# Patient Record
Sex: Male | Born: 2005 | Race: Black or African American | Hispanic: No | Marital: Single
Health system: Southern US, Community
[De-identification: ages and names within clinical notes are randomized; demographics above are authoritative.]

---
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
## 2023-04-05 IMAGING — CT CT MAXILLOFACIAL W/O CM
3 series · 16 of 47 positions shown, 19 images · non-contrast
Comparison: None.

CLINICAL DATA: Fall, facial trauma

EXAM:
CT MAXILLOFACIAL WITHOUT CONTRAST
TECHNIQUE: Multidetector CT imaging of the maxillofacial structures was
performed. Multiplanar CT image reconstructions were also generated.

[Series 3: facialbone 2.0 st · axial · 0.37mm/px · z∈[-156,-8]mm · 10 of 86 slices shown, 13 images]
[im 6/86  brain]
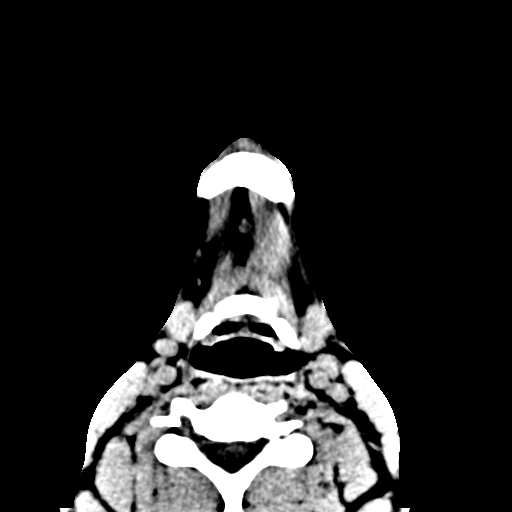
[im 6/86  bone]
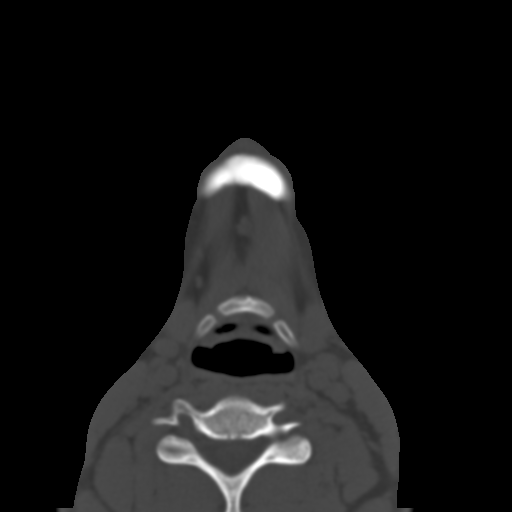
[im 15/86  bone]
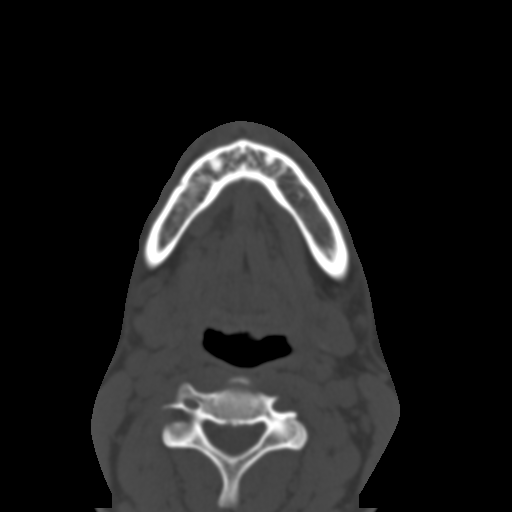
[im 24/86  bone]
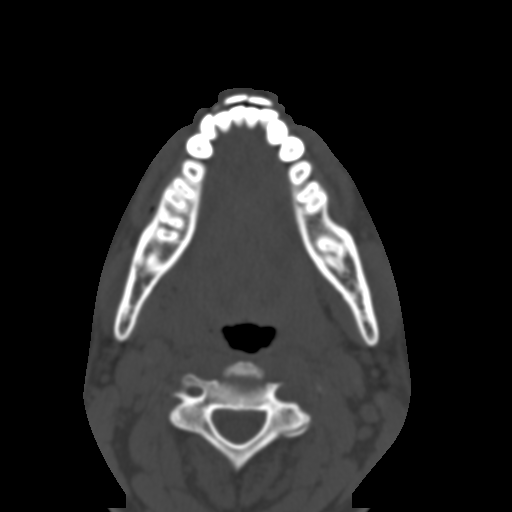
[im 30/86  bone]
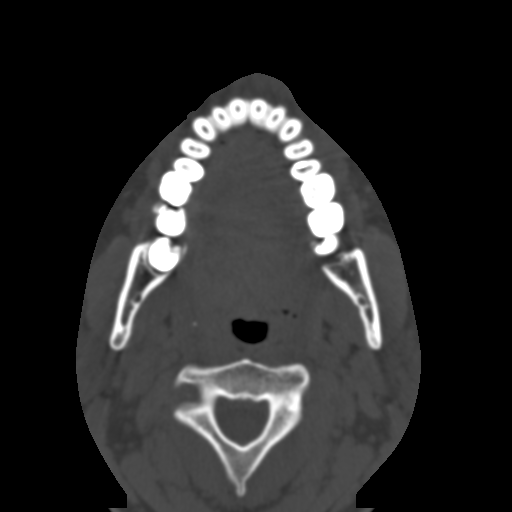
[im 39/86  brain]
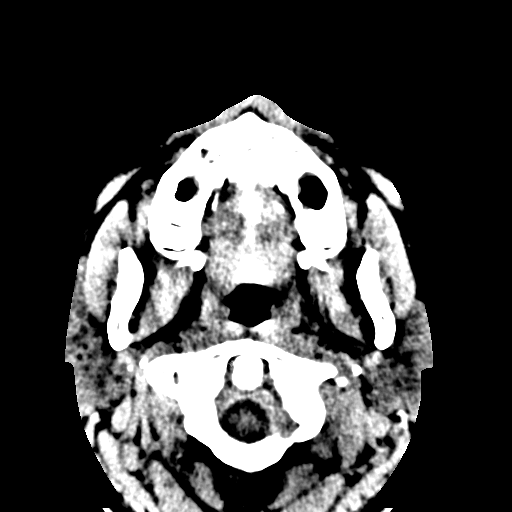
[im 39/86  bone]
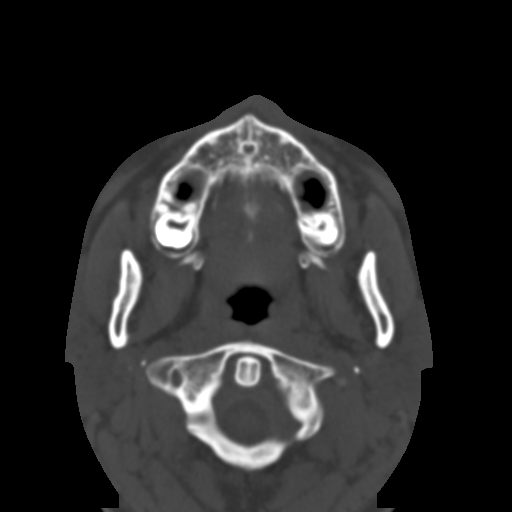
[im 47/86  bone]
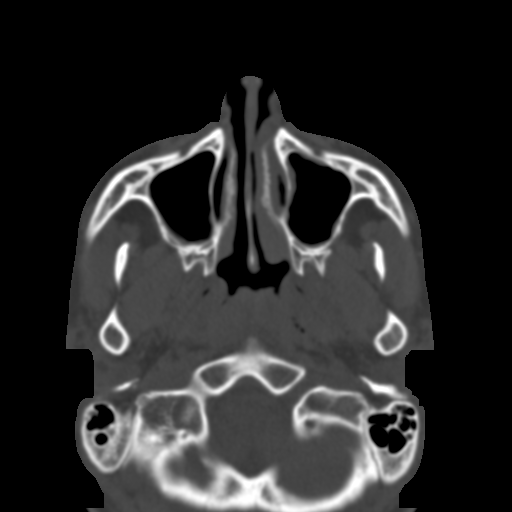
[im 56/86  bone]
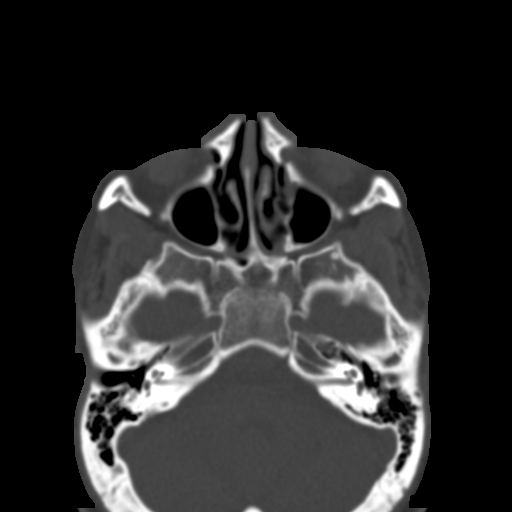
[im 65/86  bone]
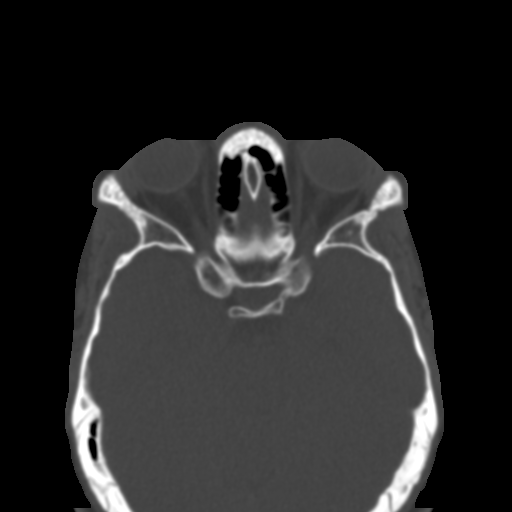
[im 71/86  brain]
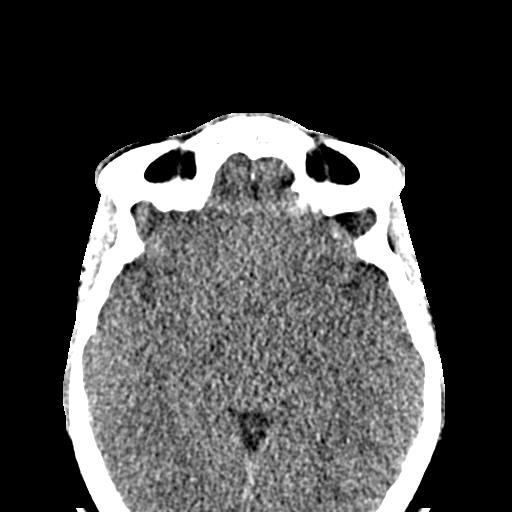
[im 71/86  bone]
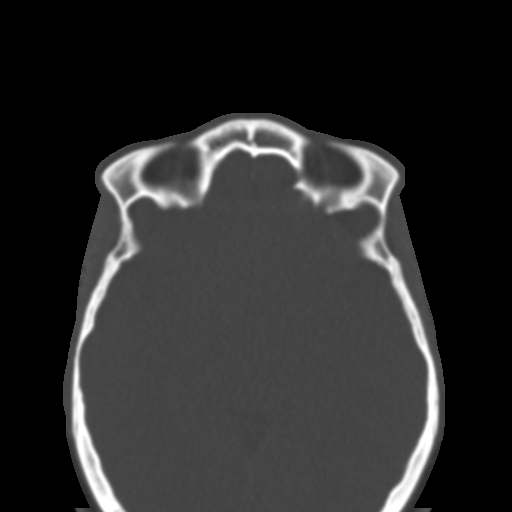
[im 80/86  bone]
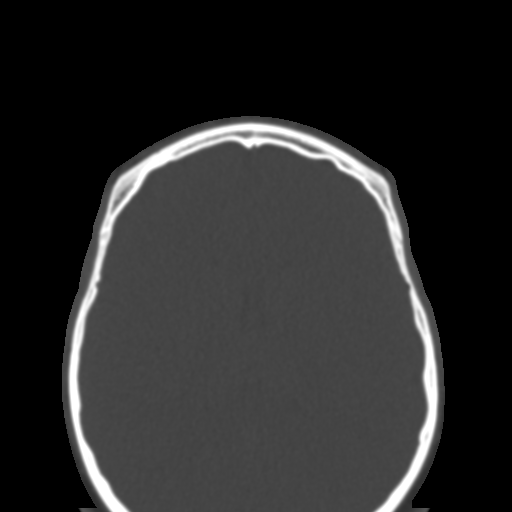

[Series 7: facialbone 2.0 cor st · coronal · 0.35mm/px · 3 of 85 slices shown]
[im 29/85  bone]
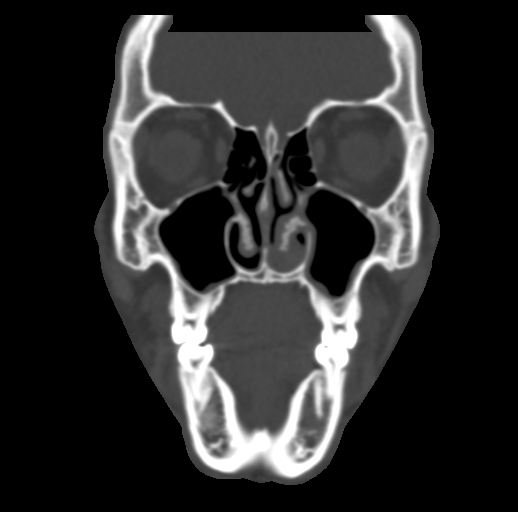
[im 38/85  bone]
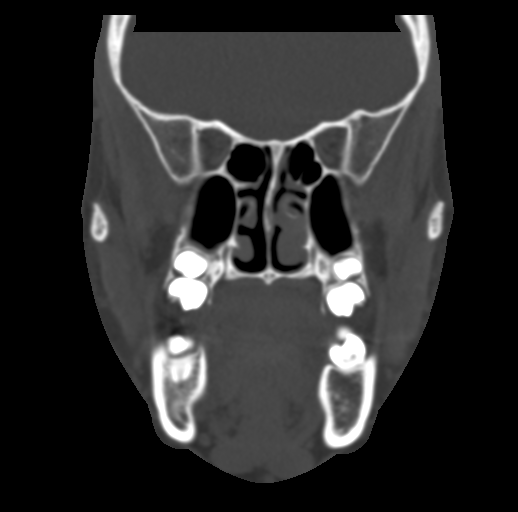
[im 47/85  bone]
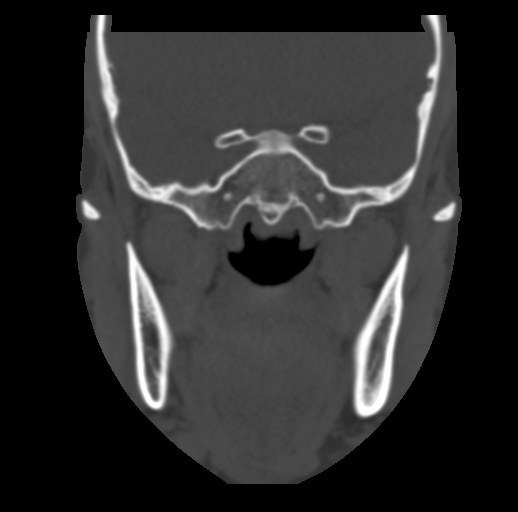

[Series 8: facialbone 2.0 sag st · sagittal · 0.34mm/px · 3 of 81 slices shown]
[im 27/81  bone]
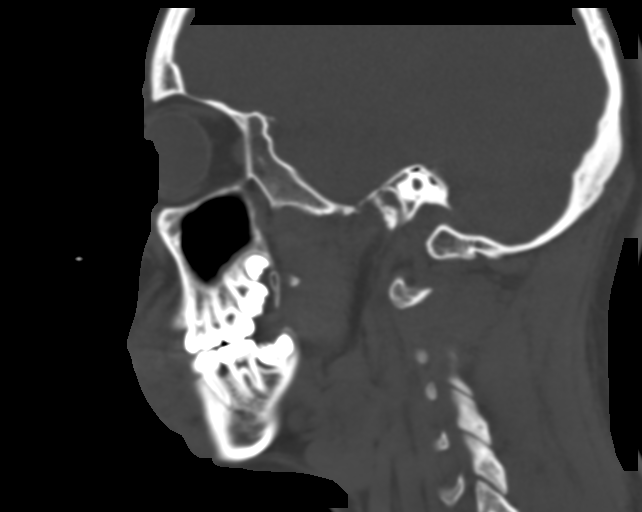
[im 41/81  bone]
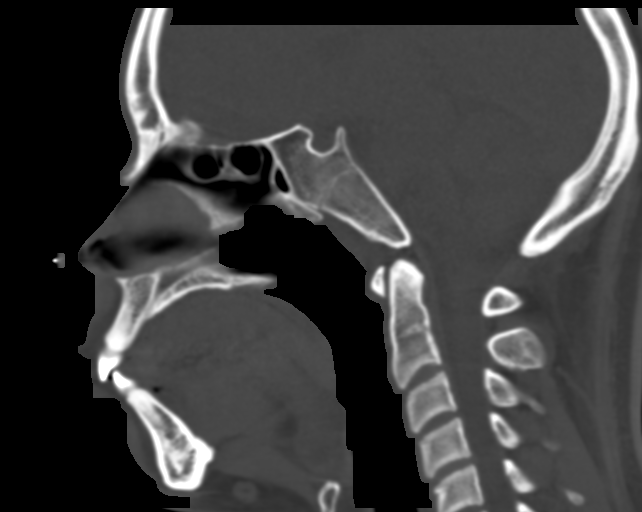
[im 54/81  bone]
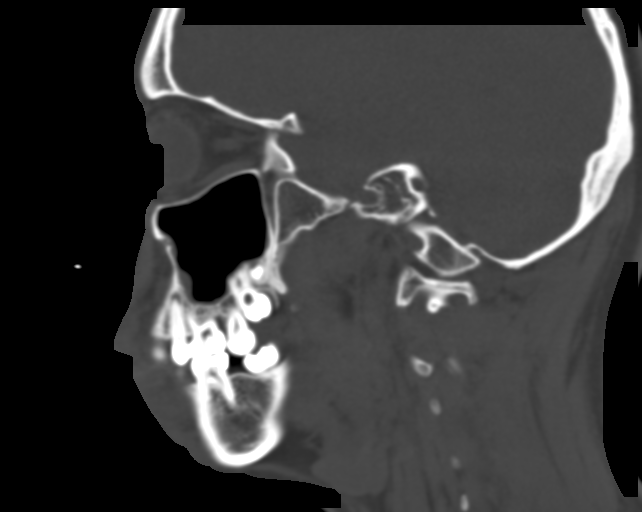

[16 of 47 positions shown; findings below may reference images not displayed]

FINDINGS: Osseous: No fracture or mandibular dislocation. No destructive
process.

Orbits: Negative. No traumatic or inflammatory finding.

Sinuses: No acute process identified. Mild chronic appearing mucosal
thickening in the maxillary and ethmoid sinuses.

Soft tissues: Negative.

Limited intracranial: No significant or unexpected finding.
IMPRESSION: No acute fracture identified.
